# Patient Record
Sex: Male | Born: 1990 | Race: Black or African American | Hispanic: No | Marital: Single | Smoking: Never smoker
Health system: Southern US, Community
[De-identification: ages and names within clinical notes are randomized; demographics above are authoritative.]

## PROBLEM LIST (undated history)

## (undated) DIAGNOSIS — S43006A Unspecified dislocation of unspecified shoulder joint, initial encounter: Secondary | ICD-10-CM

## (undated) HISTORY — PX: EYE SURGERY: SHX253

---

## 2012-12-20 DIAGNOSIS — S43006A Unspecified dislocation of unspecified shoulder joint, initial encounter: Secondary | ICD-10-CM

## 2012-12-20 HISTORY — DX: Unspecified dislocation of unspecified shoulder joint, initial encounter: S43.006A

## 2013-08-08 ENCOUNTER — Emergency Department (HOSPITAL_COMMUNITY): Payer: Self-pay

## 2013-08-08 ENCOUNTER — Emergency Department (HOSPITAL_COMMUNITY)
Admission: EM | Admit: 2013-08-08 | Discharge: 2013-08-08 | Disposition: A | Discharge status: Home / Self Care | Payer: Self-pay | Attending: Emergency Medicine | Admitting: Emergency Medicine

## 2013-08-08 ENCOUNTER — Encounter (HOSPITAL_COMMUNITY): Payer: Self-pay | Admitting: Emergency Medicine

## 2013-08-08 DIAGNOSIS — M2180 Other specified acquired deformities of unspecified limb: Secondary | ICD-10-CM | POA: Insufficient documentation

## 2013-08-08 DIAGNOSIS — Y929 Unspecified place or not applicable: Secondary | ICD-10-CM | POA: Insufficient documentation

## 2013-08-08 DIAGNOSIS — X500XXA Overexertion from strenuous movement or load, initial encounter: Secondary | ICD-10-CM | POA: Insufficient documentation

## 2013-08-08 DIAGNOSIS — Y9389 Activity, other specified: Secondary | ICD-10-CM | POA: Insufficient documentation

## 2013-08-08 DIAGNOSIS — M21829 Other specified acquired deformities of unspecified upper arm: Secondary | ICD-10-CM

## 2013-08-08 DIAGNOSIS — S43016A Anterior dislocation of unspecified humerus, initial encounter: Secondary | ICD-10-CM | POA: Insufficient documentation

## 2013-08-08 DIAGNOSIS — S43005A Unspecified dislocation of left shoulder joint, initial encounter: Secondary | ICD-10-CM

## 2013-08-08 HISTORY — DX: Unspecified dislocation of unspecified shoulder joint, initial encounter: S43.006A

## 2013-08-08 MED ORDER — SODIUM CHLORIDE 0.9 % IV SOLN
INTRAVENOUS | Status: DC
  Administered 2013-08-08: 22:00:00 via INTRAVENOUS

## 2013-08-08 MED ORDER — MIDAZOLAM HCL 2 MG/2ML IJ SOLN
4.0000 mg | Freq: Once | INTRAMUSCULAR | Status: AC
  Administered 2013-08-08: 3 mg via INTRAVENOUS

## 2013-08-08 MED ORDER — HYDROMORPHONE HCL PF 1 MG/ML IJ SOLN
1.0000 mg | Freq: Once | INTRAMUSCULAR | Status: AC
  Administered 2013-08-08: 1 mg via INTRAVENOUS
  Filled 2013-08-08: qty 1

## 2013-08-08 MED ORDER — OXYCODONE-ACETAMINOPHEN 5-325 MG PO TABS: 2.0000 | ORAL_TABLET | ORAL | Status: DC | PRN

## 2013-08-08 MED ORDER — FENTANYL CITRATE 0.05 MG/ML IJ SOLN
INTRAMUSCULAR | Status: AC
  Filled 2013-08-08: qty 2

## 2013-08-08 MED ORDER — MIDAZOLAM HCL 2 MG/2ML IJ SOLN
INTRAMUSCULAR | Status: AC
  Filled 2013-08-08: qty 2

## 2013-08-08 MED ORDER — MIDAZOLAM HCL 2 MG/2ML IJ SOLN
4.0000 mg | Freq: Once | INTRAMUSCULAR | Status: DC
  Filled 2013-08-08: qty 4

## 2013-08-08 MED ORDER — FENTANYL CITRATE 0.05 MG/ML IJ SOLN
100.0000 ug | Freq: Once | INTRAMUSCULAR | Status: AC
  Administered 2013-08-08: 100 ug via INTRAVENOUS
  Filled 2013-08-08: qty 2

## 2013-08-08 NOTE — ED Notes (Signed)
Shoulder is back in place. By Dr. Freida Busman.

## 2013-08-08 NOTE — ED Provider Notes (Signed)
CSN: [id number]     Arrival date & time 08/08/13  2113 History   First MD Initiated Contact with Patient 08/08/13 2128     Chief Complaint  Patient presents with  . Dislocation    Left shoulder   (Consider location/radiation/quality/duration/timing/severity/associated sxs/prior Treatment) The history is provided by the patient.   Patient here complaining of left shoulder pain that started after he reached back to get something out of the back seat. History of dislocation in the past from the same shoulder. Pain characterized as sharp and worse with movement. Denies any numbness to his hand. No numbness to the top of his shoulder. Called EMS and was transported here. No treatment used prior to arrival Past Medical History  Diagnosis Date  . Dislocation closed, shoulder 12/2012   Past Surgical History  Procedure Laterality Date  . Eye surgery     History reviewed. No pertinent family history. History  Substance Use Topics  . Smoking status: Never Smoker   . Smokeless tobacco: Not on file  . Alcohol Use: No    Review of Systems  All other systems reviewed and are negative.    Allergies  Other  Home Medications  No current outpatient prescriptions on file. BP 158/85  Pulse 70  Temp(Src) 98.5 F (36.9 C) (Oral)  Resp 18  SpO2 98% Physical Exam  Nursing note and vitals reviewed. Constitutional: He is oriented to person, place, and time. He appears well-developed and well-nourished.  Non-toxic appearance. No distress.  HENT:  Head: Normocephalic and atraumatic.  Eyes: Conjunctivae, EOM and lids are normal. Pupils are equal, round, and reactive to light.  Neck: Normal range of motion. Neck supple. No tracheal deviation present. No mass present.  Cardiovascular: Normal rate, regular rhythm and normal heart sounds.  Exam reveals no gallop.   No murmur heard. Pulmonary/Chest: Effort normal and breath sounds normal. No stridor. No respiratory distress. He has no decreased  breath sounds. He has no wheezes. He has no rhonchi. He has no rales.  Abdominal: Soft. Normal appearance and bowel sounds are normal. He exhibits no distension. There is no tenderness. There is no rebound and no CVA tenderness.  Musculoskeletal: Normal range of motion. He exhibits no edema and no tenderness.       Left shoulder: He exhibits deformity, pain and spasm.  Neurological: He is alert and oriented to person, place, and time. He has normal strength. No cranial nerve deficit or sensory deficit. GCS eye subscore is 4. GCS verbal subscore is 5. GCS motor subscore is 6.  Skin: Skin is warm and dry. No abrasion and no rash noted.  Psychiatric: He has a normal mood and affect. His speech is normal and behavior is normal.    ED Course  Reduction of dislocation Date/Time: 08/08/2013 10:40 PM Performed by: Toy Baker Authorized by: Lorre Nick T Consent: Verbal consent obtained. written consent obtained. Risks and benefits: risks, benefits and alternatives were discussed Consent given by: patient Patient understanding: patient states understanding of the procedure being performed Patient identity confirmed: verbally with patient Time out: Immediately prior to procedure a "time out" was called to verify the correct patient, procedure, equipment, support staff and site/side marked as required. Patient sedated: yes Sedatives: fentanyl and midazolam Analgesia: fentanyl Sedation start date/time: 08/08/2013 10:25 PM Sedation end date/time: 08/08/2013 10:41 PM Vitals: Vital signs were monitored during sedation. Patient tolerance: Patient tolerated the procedure well with no immediate complications. Comments: Patient's shoulder reduced with external rotation of the forearm. Patient  normal range of motion after the procedure   (including critical care time) Labs Review Labs Reviewed - No data to display Imaging Review No results found.  EKG Interpretation   None       MDM   No diagnosis found.   Postreduction films show good alignment. Sling applied and orthopedic referral given  Toy Baker, MD 08/08/13 2333

## 2013-08-08 NOTE — ED Notes (Signed)
Pt in shoulder sling.

## 2013-08-08 NOTE — ED Notes (Signed)
Per EMS pt was reaching in back seat to get back and shoulder popped out of place. Pt states hx of the same shoulder dislocating 6 months ago. Pt calm but guarded on arrival.

## 2013-08-08 NOTE — ED Notes (Signed)
Patient transported to X-ray 

## 2014-03-03 ENCOUNTER — Emergency Department (HOSPITAL_COMMUNITY): Payer: Self-pay

## 2014-03-03 ENCOUNTER — Encounter (HOSPITAL_COMMUNITY): Payer: Self-pay | Admitting: Emergency Medicine

## 2014-03-03 ENCOUNTER — Emergency Department (HOSPITAL_COMMUNITY)
Admission: EM | Admit: 2014-03-03 | Discharge: 2014-03-03 | Disposition: A | Discharge status: Home / Self Care | Payer: Self-pay | Attending: Emergency Medicine | Admitting: Emergency Medicine

## 2014-03-03 DIAGNOSIS — R110 Nausea: Secondary | ICD-10-CM

## 2014-03-03 DIAGNOSIS — R112 Nausea with vomiting, unspecified: Secondary | ICD-10-CM | POA: Insufficient documentation

## 2014-03-03 DIAGNOSIS — R1084 Generalized abdominal pain: Secondary | ICD-10-CM | POA: Insufficient documentation

## 2014-03-03 DIAGNOSIS — R109 Unspecified abdominal pain: Secondary | ICD-10-CM

## 2014-03-03 DIAGNOSIS — R0602 Shortness of breath: Secondary | ICD-10-CM | POA: Insufficient documentation

## 2014-03-03 LAB — CBC WITH DIFFERENTIAL/PLATELET
Basophils Absolute: 0 10*3/uL (ref 0.0–0.1)
Basophils Relative: 0 % (ref 0–1)
EOS ABS: 0.1 10*3/uL (ref 0.0–0.7)
EOS PCT: 1 % (ref 0–5)
HCT: 42.7 % (ref 39.0–52.0)
Hemoglobin: 15 g/dL (ref 13.0–17.0)
LYMPHS ABS: 2 10*3/uL (ref 0.7–4.0)
Lymphocytes Relative: 19 % (ref 12–46)
MCH: 30.4 pg (ref 26.0–34.0)
MCHC: 35.1 g/dL (ref 30.0–36.0)
MCV: 86.4 fL (ref 78.0–100.0)
Monocytes Absolute: 0.8 10*3/uL (ref 0.1–1.0)
Monocytes Relative: 7 % (ref 3–12)
Neutro Abs: 7.9 10*3/uL — ABNORMAL HIGH (ref 1.7–7.7)
Neutrophils Relative %: 73 % (ref 43–77)
Platelets: 182 10*3/uL (ref 150–400)
RBC: 4.94 MIL/uL (ref 4.22–5.81)
RDW: 12.9 % (ref 11.5–15.5)
WBC: 10.8 10*3/uL — ABNORMAL HIGH (ref 4.0–10.5)

## 2014-03-03 LAB — COMPREHENSIVE METABOLIC PANEL
ALT: 10 U/L (ref 0–53)
AST: 20 U/L (ref 0–37)
Albumin: 4.8 g/dL (ref 3.5–5.2)
Alkaline Phosphatase: 49 U/L (ref 39–117)
BUN: 13 mg/dL (ref 6–23)
CALCIUM: 9.8 mg/dL (ref 8.4–10.5)
CO2: 26 meq/L (ref 19–32)
CREATININE: 1.09 mg/dL (ref 0.50–1.35)
Chloride: 101 mEq/L (ref 96–112)
GLUCOSE: 96 mg/dL (ref 70–99)
Potassium: 3.7 mEq/L (ref 3.7–5.3)
Sodium: 140 mEq/L (ref 137–147)
Total Bilirubin: 0.7 mg/dL (ref 0.3–1.2)
Total Protein: 7.6 g/dL (ref 6.0–8.3)

## 2014-03-03 LAB — URINALYSIS, ROUTINE W REFLEX MICROSCOPIC
Bilirubin Urine: NEGATIVE
Glucose, UA: NEGATIVE mg/dL
Hgb urine dipstick: NEGATIVE
Ketones, ur: NEGATIVE mg/dL
Leukocytes, UA: NEGATIVE
Nitrite: NEGATIVE
Protein, ur: NEGATIVE mg/dL
Specific Gravity, Urine: 1.004 — ABNORMAL LOW (ref 1.005–1.030)
Urobilinogen, UA: 1 mg/dL (ref 0.0–1.0)
pH: 7 (ref 5.0–8.0)

## 2014-03-03 MED ORDER — ONDANSETRON 4 MG PO TBDP: ORAL_TABLET | ORAL | Status: AC

## 2014-03-03 MED ORDER — ONDANSETRON HCL 4 MG/2ML IJ SOLN
4.0000 mg | Freq: Once | INTRAMUSCULAR | Status: AC
  Administered 2014-03-03: 4 mg via INTRAVENOUS
  Filled 2014-03-03: qty 2

## 2014-03-03 MED ORDER — SODIUM CHLORIDE 0.9 % IV BOLUS (SEPSIS)
1000.0000 mL | INTRAVENOUS | Status: AC
  Administered 2014-03-03: 1000 mL via INTRAVENOUS

## 2014-03-03 NOTE — ED Notes (Signed)
Bed: [id number] Expected date:  Expected time:  Means of arrival:  Comments: EMS 65M palpitations

## 2014-03-03 NOTE — ED Provider Notes (Signed)
CSN: [id number]     Arrival date & time 03/03/14  1942 History   First MD Initiated Contact with Patient 03/03/14 1957     Chief Complaint  Patient presents with  . Nausea     (Consider location/radiation/quality/duration/timing/severity/associated sxs/prior Treatment) Patient is a 23 y.o. male presenting with abdominal pain. The history is provided by the patient.  Abdominal Pain Pain location:  Generalized Pain quality: cramping   Pain radiates to:  Does not radiate Pain severity:  Mild Onset quality:  Gradual Timing:  Intermittent Progression:  Resolved Chronicity:  New Context comment:  At rest Relieved by:  Nothing Worsened by:  Nothing tried Ineffective treatments:  None tried Associated symptoms: nausea and shortness of breath   Associated symptoms: no chest pain, no cough, no diarrhea, no dysuria, no fever, no hematuria and no vomiting     Past Medical History  Diagnosis Date  . Dislocation closed, shoulder 12/2012   Past Surgical History  Procedure Laterality Date  . Eye surgery     History reviewed. No pertinent family history. History  Substance Use Topics  . Smoking status: Never Smoker   . Smokeless tobacco: Not on file  . Alcohol Use: No    Review of Systems  Constitutional: Negative for fever.  HENT: Negative for drooling and rhinorrhea.   Eyes: Negative for pain.  Respiratory: Positive for shortness of breath. Negative for cough.   Cardiovascular: Negative for chest pain and leg swelling.  Gastrointestinal: Positive for nausea. Negative for vomiting, abdominal pain and diarrhea.  Genitourinary: Negative for dysuria and hematuria.  Musculoskeletal: Negative for gait problem and neck pain.  Skin: Negative for color change.  Neurological: Negative for numbness and headaches.  Hematological: Negative for adenopathy.  Psychiatric/Behavioral: Negative for behavioral problems.  All other systems reviewed and are negative.     Allergies   Other  Home Medications   Prior to Admission medications   Medication Sig Start Date End Date Taking? Authorizing Provider  oxyCODONE-acetaminophen (PERCOCET/ROXICET) 5-325 MG per tablet Take 2 tablets by mouth every 4 (four) hours as needed for severe pain. 08/08/13   Toy BakerAnthony T Allen, MD   BP 134/82  Pulse 91  Temp(Src) 98.3 F (36.8 C) (Oral)  Resp 18  SpO2 100% Physical Exam  Nursing note and vitals reviewed. Constitutional: He is oriented to person, place, and time. He appears well-developed and well-nourished.  HENT:  Head: Normocephalic and atraumatic.  Right Ear: External ear normal.  Left Ear: External ear normal.  Nose: Nose normal.  Mouth/Throat: Oropharynx is clear and moist. No oropharyngeal exudate.  Eyes: Conjunctivae and EOM are normal. Pupils are equal, round, and reactive to light.  Neck: Normal range of motion. Neck supple.  Cardiovascular: Normal rate, regular rhythm, normal heart sounds and intact distal pulses.  Exam reveals no gallop and no friction rub.   No murmur heard. Pulmonary/Chest: Effort normal and breath sounds normal. No respiratory distress. He has no wheezes.  Abdominal: Soft. Bowel sounds are normal. He exhibits no distension. There is no tenderness. There is no rebound and no guarding.  Musculoskeletal: Normal range of motion. He exhibits no edema and no tenderness.  Neurological: He is alert and oriented to person, place, and time.  Skin: Skin is warm and dry.  Psychiatric: He has a normal mood and affect. His behavior is normal.    ED Course  Procedures (including critical care time) Labs Review Labs Reviewed  CBC WITH DIFFERENTIAL - Abnormal; Notable for the following:  WBC 10.8 (*)    Neutro Abs 7.9 (*)    All other components within normal limits  URINALYSIS, ROUTINE W REFLEX MICROSCOPIC - Abnormal; Notable for the following:    Specific Gravity, Urine 1.004 (*)    All other components within normal limits  COMPREHENSIVE  METABOLIC PANEL    Imaging Review Dg Chest 2 View  03/03/2014   CLINICAL DATA:  Pain and nausea.  Shortness breath.  EXAM: CHEST  2 VIEW  COMPARISON:  None.  FINDINGS: The heart size and mediastinal contours are within normal limits. Both lungs are clear. The visualized skeletal structures are unremarkable.  IMPRESSION: Negative two view chest x-ray.   Electronically Signed   By: Gennette Pachris  Mattern M.D.   On: 03/03/2014 20:49     EKG Interpretation None      MDM   Final diagnoses:  Nausea  Abdominal cramping    8:24 PM 23 y.o. male who presents with nausea, intermittent abdominal cramping which began today. He also notes some mild intermittent shortness of breath over the last few days since he recently had a cold. He denies any vomiting, diarrhea, fever. He currently does not have any abdominal pain on exam. He states that he has had a decreased appetite over the last few days. He is afebrile and vital signs are unremarkable here. Will get screening labs and chest x-ray. Abdomen is soft and benign.  10:44 PM: I interpreted/reviewed the labs and/or imaging which were non-contributory.   I have discussed the diagnosis/risks/treatment options with the patient and believe the pt to be eligible for discharge home to follow-up with and establish w/ a pcp as needed. We also discussed returning to the ED immediately if new or worsening sx occur. We discussed the sx which are most concerning (e.g., worsening pain, fever, vomiting) that necessitate immediate return. Medications administered to the patient during their visit and any new prescriptions provided to the patient are listed below.  Medications given during this visit Medications  sodium chloride 0.9 % bolus 1,000 mL (0 mLs Intravenous Stopped 03/03/14 2206)  ondansetron (ZOFRAN) injection 4 mg (4 mg Intravenous Given 03/03/14 2049)    Discharge Medication List as of 03/03/2014 10:47 PM    START taking these medications   Details   ondansetron (ZOFRAN ODT) 4 MG disintegrating tablet 4mg  ODT q4 hours prn nausea/vomit, Print         Junius ArgyleForrest S Netanya Yazdani, MD 03/03/14 2347

## 2014-03-03 NOTE — ED Notes (Signed)
Per EMS pt c/o nausea  X one week w/ accompanying feeling that his heart is racing.  Pt called EMS d/t increase in sx.

## 2014-03-03 NOTE — ED Notes (Signed)
Pt reports one week hx of nausea w/o vomiting, poor po intake.  Pt also reports feeling as if his heart is racing and he feels dizzy frequently.  Pt states he just "feels weird."

## 2015-08-08 IMAGING — CR DG SHOULDER 1V*L*
2 series · 2 of 2 positions shown · non-contrast
Comparison: 08/08/2013 at [DATE] p.m.

CLINICAL DATA: Dislocated left shoulder

EXAM:
PORTABLE LEFT SHOULDER - 2+ VIEW

[AP (1 of 2)]
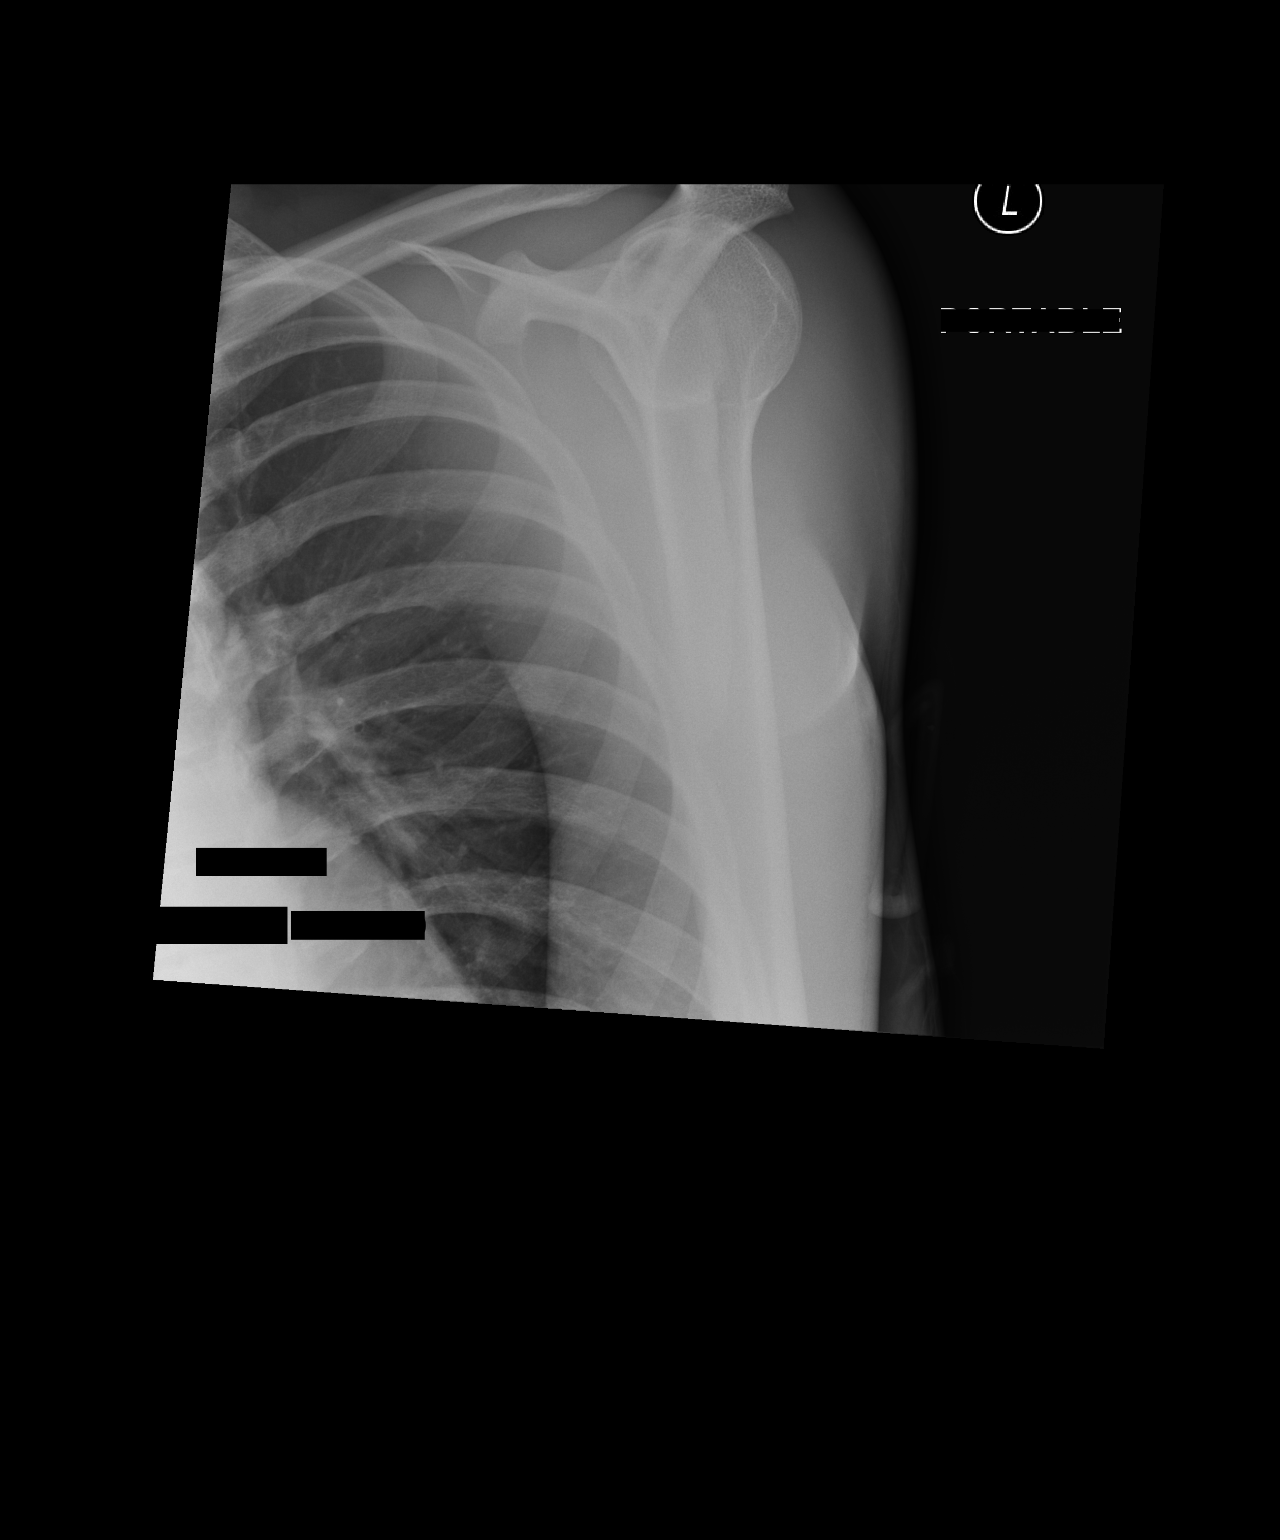

[AP (2 of 2)]
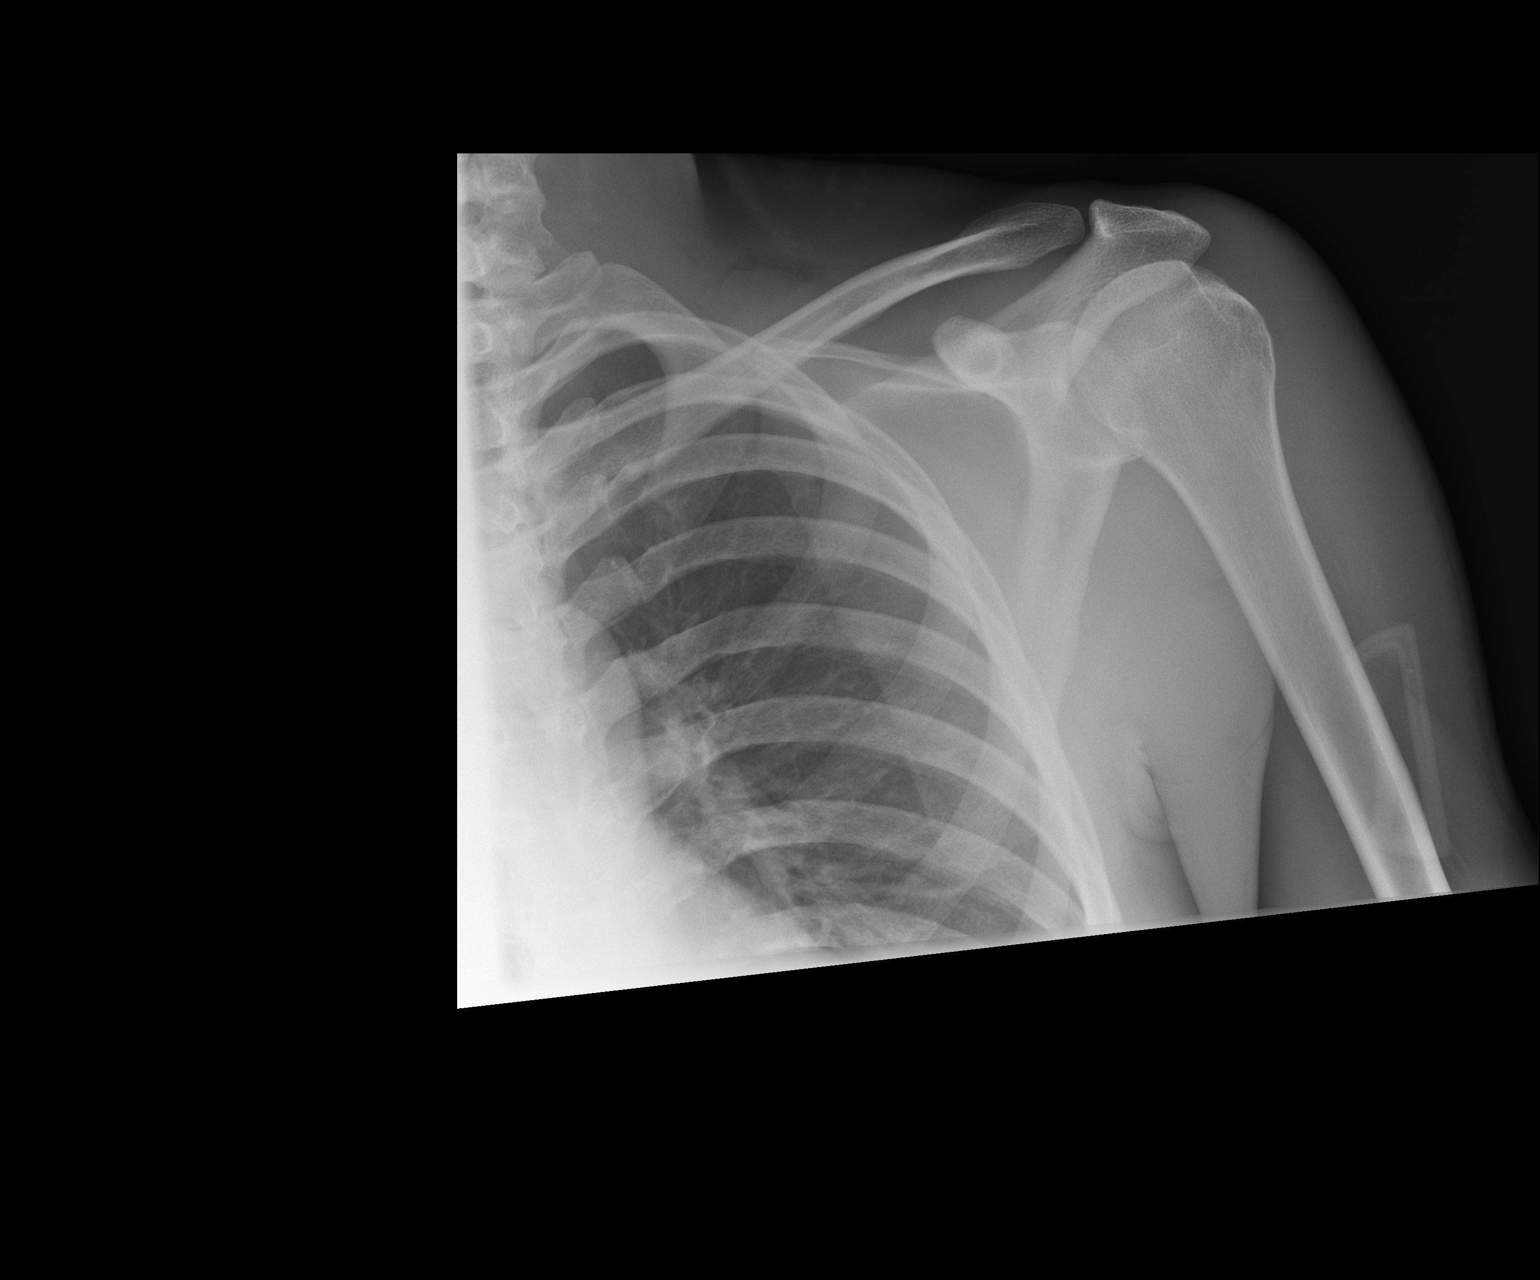

[2 of 2 positions shown; findings below may reference images not displayed]

FINDINGS: Relocated left glenohumeral joint. Hill-Sachs deformity noted. No
appreciable glenoid fracture. No evidence of trauma to the upper
left chest.
IMPRESSION: 1. Relocated glenohumeral joint.
2. Hill-Sachs deformity.

## 2015-08-08 IMAGING — CR DG SHOULDER 2+V*L*
2 series · 2 of 2 positions shown · non-contrast
Comparison: None.

CLINICAL DATA: Shoulder pain and dislocation.  Prior dislocation.

EXAM:
LEFT SHOULDER - 2+ VIEW

[w shoulder internal left]
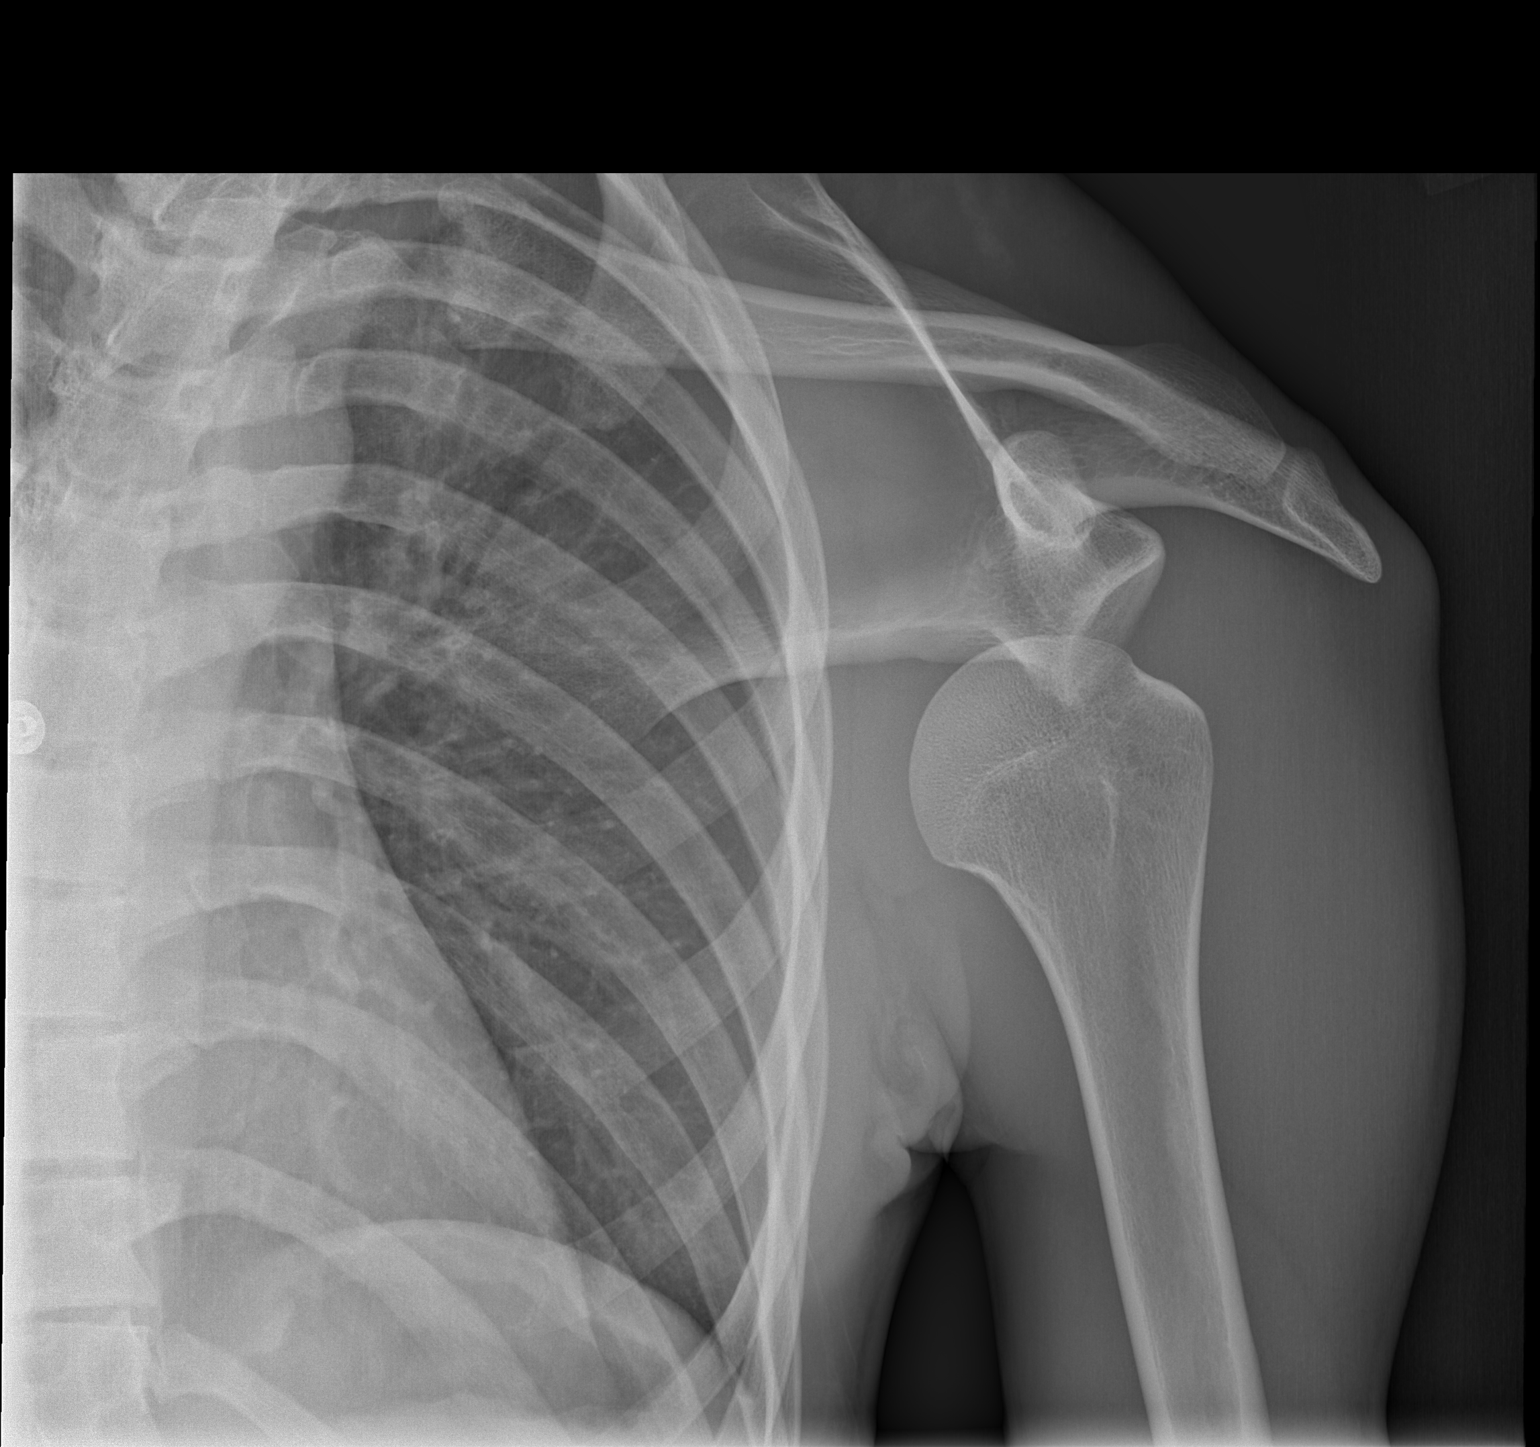

[w shoulder y-view left]
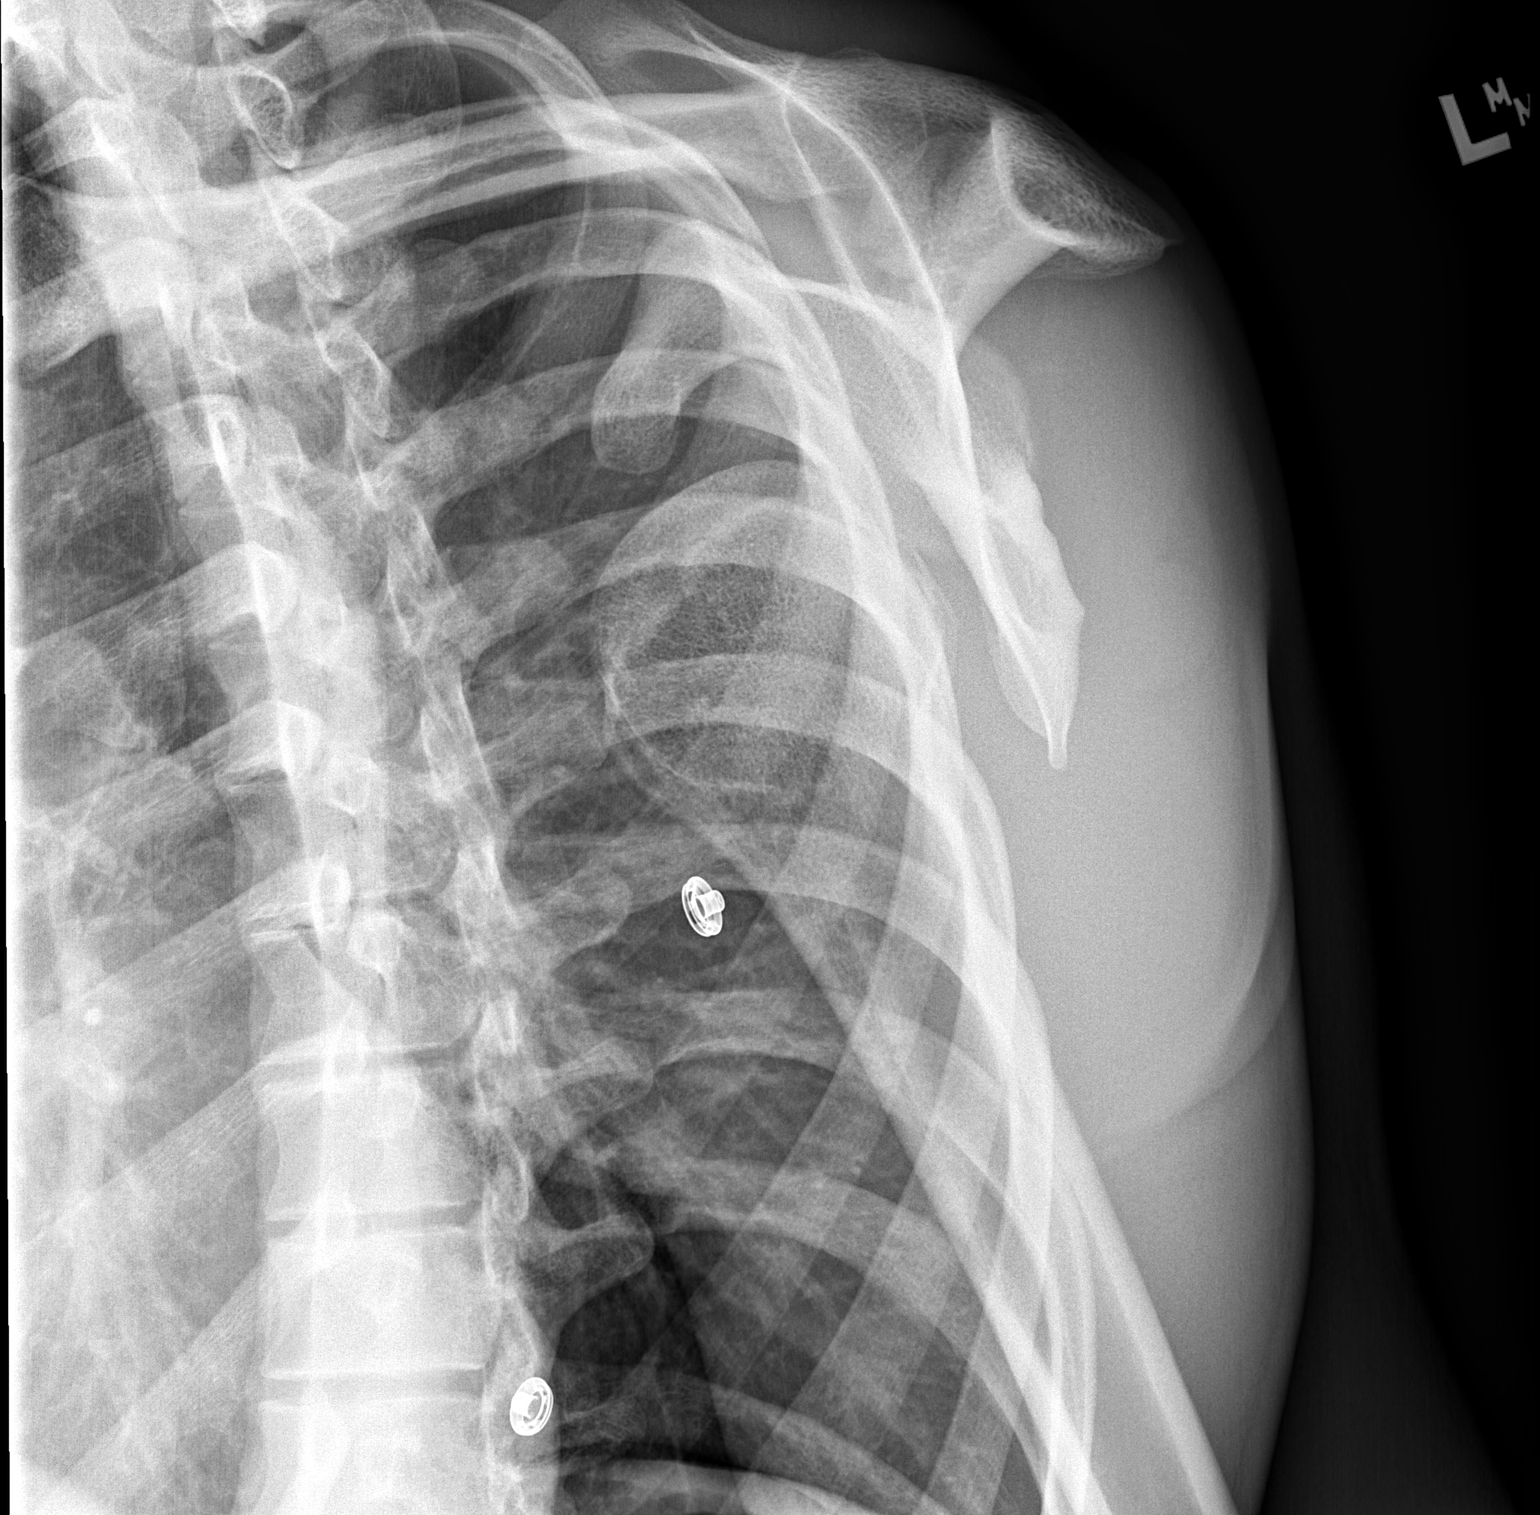

[2 of 2 positions shown; findings below may reference images not displayed]

FINDINGS: There is anterior inferior dislocation of the humeral head with
suspected glenoid. No bony Bankart injury or definite Hill-Sachs
impaction is currently observed. No fracture identified.
IMPRESSION: 1. Anterior dislocation of the humeral head with respect to the
glenoid.

## 2016-03-02 IMAGING — CR DG CHEST 2V
2 series · 2 of 2 positions shown · non-contrast
Comparison: None.

CLINICAL DATA: Pain and nausea.  Shortness breath.

EXAM:
CHEST  2 VIEW

[w chest pa]
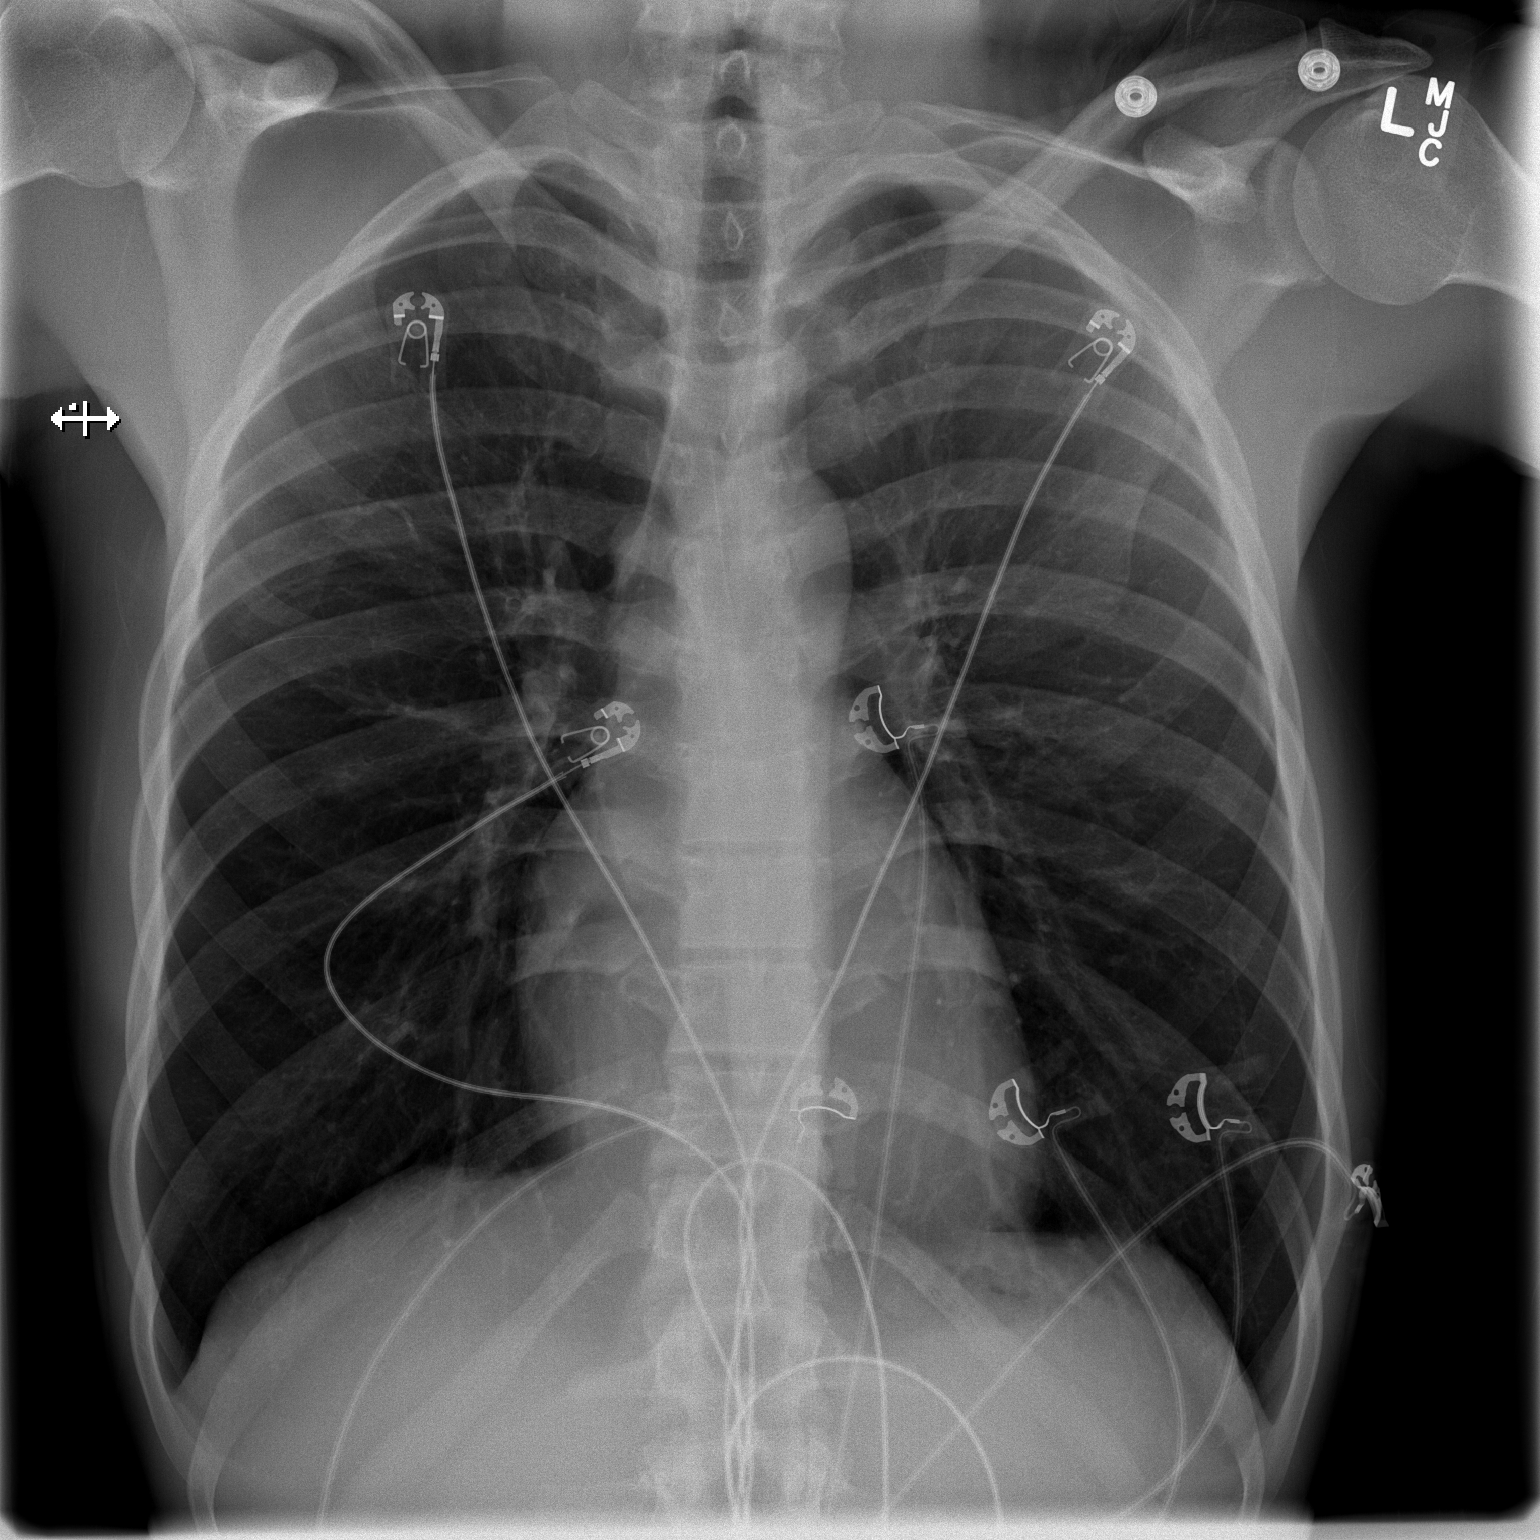

[w chest lat]
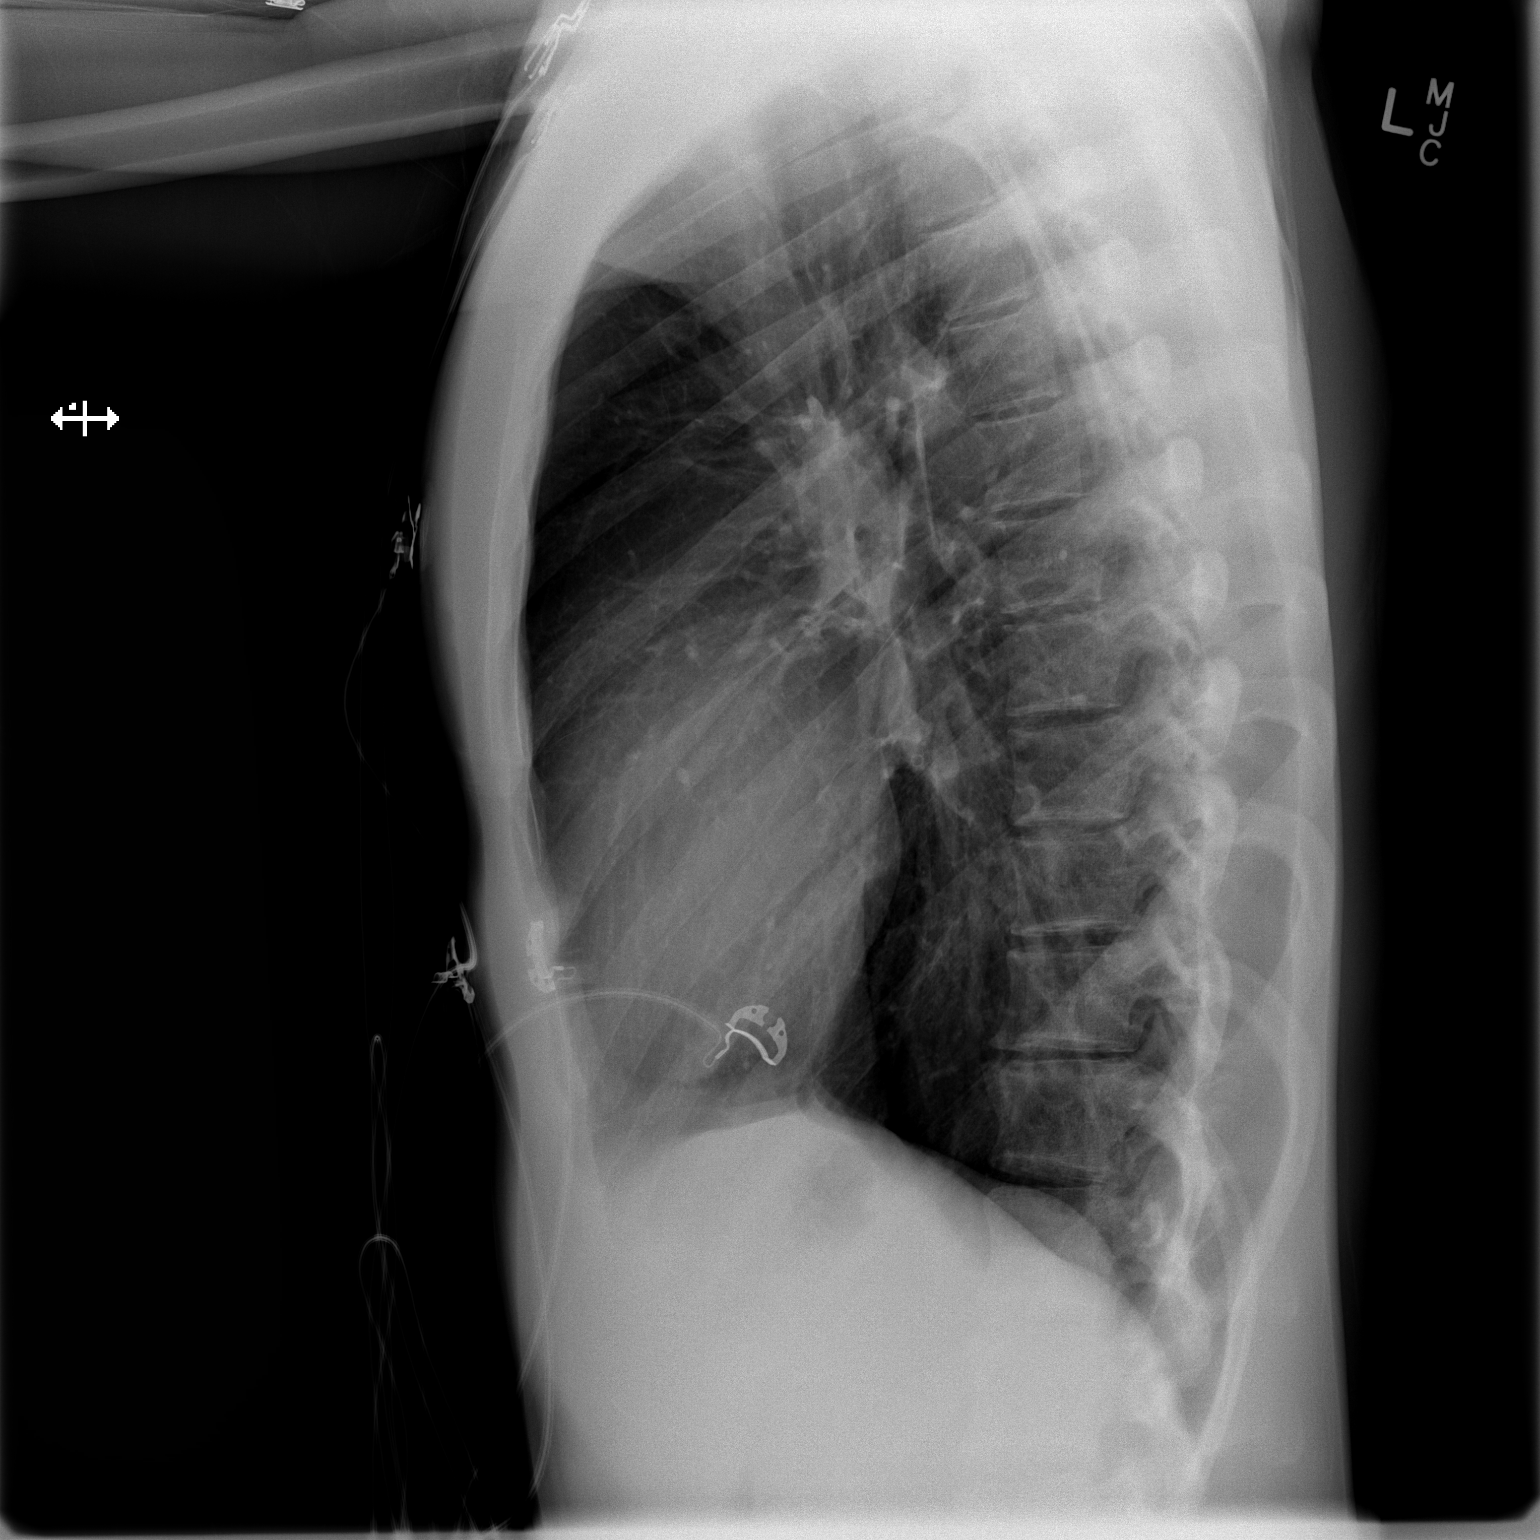

[2 of 2 positions shown; findings below may reference images not displayed]

FINDINGS: The heart size and mediastinal contours are within normal limits.
Both lungs are clear. The visualized skeletal structures are
unremarkable.
IMPRESSION: Negative two view chest x-ray.
